# Patient Record
Sex: Female | Born: 1975 | Hispanic: Yes | Marital: Single
Health system: Southern US, Community
[De-identification: ages and names within clinical notes are randomized; demographics above are authoritative.]

## PROBLEM LIST (undated history)

## (undated) HISTORY — PX: HEMORRHOID SURGERY: SHX153

## (undated) HISTORY — PX: TUBAL LIGATION: SHX77

---

## 2004-03-17 ENCOUNTER — Inpatient Hospital Stay: Payer: Self-pay

## 2004-07-15 ENCOUNTER — Ambulatory Visit: Payer: Self-pay | Admitting: Obstetrics and Gynecology

## 2005-03-31 ENCOUNTER — Emergency Department: Payer: Self-pay | Admitting: Internal Medicine

## 2009-10-17 ENCOUNTER — Emergency Department: Payer: Self-pay | Admitting: Emergency Medicine

## 2009-12-03 ENCOUNTER — Ambulatory Visit: Payer: Self-pay | Admitting: Internal Medicine

## 2014-01-27 ENCOUNTER — Ambulatory Visit: Payer: Self-pay | Admitting: Obstetrics and Gynecology

## 2017-06-19 ENCOUNTER — Other Ambulatory Visit: Payer: Self-pay | Admitting: Obstetrics and Gynecology

## 2017-06-19 DIAGNOSIS — Z1231 Encounter for screening mammogram for malignant neoplasm of breast: Secondary | ICD-10-CM

## 2020-12-23 ENCOUNTER — Other Ambulatory Visit: Payer: Self-pay | Admitting: Obstetrics and Gynecology

## 2020-12-23 DIAGNOSIS — Z1231 Encounter for screening mammogram for malignant neoplasm of breast: Secondary | ICD-10-CM

## 2021-01-18 ENCOUNTER — Other Ambulatory Visit: Payer: Self-pay

## 2021-01-18 ENCOUNTER — Ambulatory Visit
Admission: RE | Admit: 2021-01-18 | Discharge: 2021-01-18 | Disposition: A | Discharge status: Home / Self Care | Payer: Managed Care, Other (non HMO) | Source: Ambulatory Visit | Attending: Obstetrics and Gynecology | Admitting: Obstetrics and Gynecology

## 2021-01-18 DIAGNOSIS — Z1231 Encounter for screening mammogram for malignant neoplasm of breast: Secondary | ICD-10-CM | POA: Insufficient documentation

## 2021-12-01 ENCOUNTER — Encounter: Payer: Self-pay | Admitting: *Deleted

## 2021-12-02 ENCOUNTER — Ambulatory Visit: Admission: RE | Admit: 2021-12-02 | Payer: Managed Care, Other (non HMO) | Source: Home / Self Care

## 2021-12-02 ENCOUNTER — Encounter
Admission: RE | Disposition: A | Discharge status: Home / Self Care | Payer: Self-pay | Source: Home / Self Care | Attending: Gastroenterology

## 2021-12-02 ENCOUNTER — Ambulatory Visit
Admission: RE | Admit: 2021-12-02 | Discharge: 2021-12-02 | Disposition: A | Discharge status: Home / Self Care | Payer: Managed Care, Other (non HMO) | Attending: Gastroenterology | Admitting: Gastroenterology

## 2021-12-02 ENCOUNTER — Encounter: Admission: RE | Payer: Self-pay | Source: Home / Self Care

## 2021-12-02 ENCOUNTER — Other Ambulatory Visit: Payer: Self-pay

## 2021-12-02 ENCOUNTER — Ambulatory Visit: Payer: Managed Care, Other (non HMO) | Admitting: Certified Registered"

## 2021-12-02 ENCOUNTER — Encounter: Payer: Self-pay | Admitting: *Deleted

## 2021-12-02 DIAGNOSIS — K449 Diaphragmatic hernia without obstruction or gangrene: Secondary | ICD-10-CM | POA: Diagnosis not present

## 2021-12-02 DIAGNOSIS — Z1211 Encounter for screening for malignant neoplasm of colon: Secondary | ICD-10-CM | POA: Diagnosis present

## 2021-12-02 DIAGNOSIS — D125 Benign neoplasm of sigmoid colon: Secondary | ICD-10-CM | POA: Diagnosis not present

## 2021-12-02 DIAGNOSIS — K640 First degree hemorrhoids: Secondary | ICD-10-CM | POA: Insufficient documentation

## 2021-12-02 DIAGNOSIS — K2100 Gastro-esophageal reflux disease with esophagitis, without bleeding: Secondary | ICD-10-CM | POA: Diagnosis not present

## 2021-12-02 HISTORY — PX: ESOPHAGOGASTRODUODENOSCOPY (EGD) WITH PROPOFOL: SHX5813

## 2021-12-02 HISTORY — PX: COLONOSCOPY WITH PROPOFOL: SHX5780

## 2021-12-02 LAB — POCT PREGNANCY, URINE: Preg Test, Ur: NEGATIVE

## 2021-12-02 SURGERY — COLONOSCOPY WITH PROPOFOL
Anesthesia: General

## 2021-12-02 MED ORDER — SODIUM CHLORIDE 0.9 % IV SOLN: INTRAVENOUS | Status: DC

## 2021-12-02 MED ORDER — LIDOCAINE HCL (CARDIAC) PF 100 MG/5ML IV SOSY
PREFILLED_SYRINGE | INTRAVENOUS | Status: DC | PRN
  Administered 2021-12-02: 40 mg via INTRAVENOUS

## 2021-12-02 MED ORDER — PROPOFOL 500 MG/50ML IV EMUL
INTRAVENOUS | Status: DC | PRN
  Administered 2021-12-02: 125 ug/kg/min via INTRAVENOUS

## 2021-12-02 MED ORDER — PROPOFOL 10 MG/ML IV BOLUS
INTRAVENOUS | Status: DC | PRN
  Administered 2021-12-02: 40 mg via INTRAVENOUS
  Administered 2021-12-02: 100 mg via INTRAVENOUS

## 2021-12-02 NOTE — Transfer of Care (Signed)
Immediate Anesthesia Transfer of Care Note  Patient: [place]  Procedure(s) Performed: COLONOSCOPY WITH PROPOFOL ESOPHAGOGASTRODUODENOSCOPY (EGD) WITH PROPOFOL  Patient Location: PACU  Anesthesia Type:General  Level of Consciousness: awake  Airway & Oxygen Therapy: Patient Spontanous Breathing and Patient connected to nasal cannula oxygen  Post-op Assessment: Report given to RN and Post -op Vital signs reviewed and stable  Post vital signs: Reviewed and stable  Last Vitals:  Vitals Value Taken Time  BP    Temp    Pulse    Resp 22 12/02/21 1037  SpO2    Vitals shown include unvalidated device data.  Last Pain:  Vitals:   12/02/21 0950  TempSrc: Temporal  PainSc: 0-No pain         Complications: No notable events documented.

## 2021-12-02 NOTE — H&P (Signed)
Outpatient short stay form Pre-procedure 12/02/2021  Elizabeth Rubenstein, MD  Primary Physician: Dion Body, MD  Reason for visit:  GERD/Colon cancer screening  History of present illness:    46 y/o lady with history of GERD here for EGD/Colonoscopy for GERD and index colon cancer screening. No blood thinners. No significant abdominal surgeries. First cousin with gastric cancer.   No current facility-administered medications for this encounter.  No medications prior to admission.     Not on File   History reviewed. No pertinent past medical history.  Review of systems:  Otherwise negative.    Physical Exam  Gen: Alert, oriented. Appears stated age.  HEENT: PERRLA. Lungs: No respiratory distress CV: RRR Abd: soft, benign, no masses Ext: No edema    Planned procedures: Proceed with EGD/colonoscopy. The patient understands the nature of the planned procedure, indications, risks, alternatives and potential complications including but not limited to bleeding, infection, perforation, damage to internal organs and possible oversedation/side effects from anesthesia. The patient agrees and gives consent to proceed.  Please refer to procedure notes for findings, recommendations and patient disposition/instructions.     Elizabeth Rubenstein, MD [facility]

## 2021-12-02 NOTE — Anesthesia Postprocedure Evaluation (Signed)
Anesthesia Post Note  Patient: [place]  Procedure(s) Performed: COLONOSCOPY WITH PROPOFOL ESOPHAGOGASTRODUODENOSCOPY (EGD) WITH PROPOFOL  Patient location during evaluation: Endoscopy Anesthesia Type: General Level of consciousness: awake and alert Pain management: pain level controlled Vital Signs Assessment: post-procedure vital signs reviewed and stable Respiratory status: spontaneous breathing, nonlabored ventilation and respiratory function stable Cardiovascular status: blood pressure returned to baseline and stable Postop Assessment: no apparent nausea or vomiting Anesthetic complications: no   No notable events documented.   Last Vitals:  Vitals:   12/02/21 1057 12/02/21 1107  BP: 124/86 (!) 126/95  Pulse: 63 64  Resp: 16 14  Temp:    SpO2: 100% 100%    Last Pain:  Vitals:   12/02/21 1107  TempSrc:   PainSc: 0-No pain                 [place] Ouch

## 2021-12-02 NOTE — Anesthesia Preprocedure Evaluation (Addendum)
Anesthesia Evaluation  Patient identified by MRN, date of birth, ID band Patient awake    Reviewed: Allergy & Precautions, NPO status , Patient's Chart, lab work & pertinent test results  Airway Mallampati: III  TM Distance: >3 FB Neck ROM: full    Dental no notable dental hx.    Pulmonary neg pulmonary ROS,    Pulmonary exam normal        Cardiovascular negative cardio ROS Normal cardiovascular exam     Neuro/Psych negative neurological ROS  negative psych ROS   GI/Hepatic negative GI ROS, Neg liver ROS,   Endo/Other  negative endocrine ROS  Renal/GU negative Renal ROS  negative genitourinary   Musculoskeletal   Abdominal Normal abdominal exam  (+)   Peds  Hematology negative hematology ROS (+)   Anesthesia Other Findings History reviewed. No pertinent past medical history.  History reviewed. No pertinent surgical history.     Reproductive/Obstetrics negative OB ROS                            Anesthesia Physical Anesthesia Plan  ASA: 2  Anesthesia Plan: General   Post-op Pain Management:    Induction: Intravenous  PONV Risk Score and Plan: Propofol infusion and TIVA  Airway Management Planned: Natural Airway  Additional Equipment:   Intra-op Plan:   Post-operative Plan:   Informed Consent: I have reviewed the patients History and Physical, chart, labs and discussed the procedure including the risks, benefits and alternatives for the proposed anesthesia with the patient or authorized representative who has indicated his/her understanding and acceptance.     Dental Advisory Given  Plan Discussed with: Anesthesiologist, CRNA and Surgeon  Anesthesia Plan Comments:        Anesthesia Quick Evaluation

## 2021-12-02 NOTE — Op Note (Signed)
[facility] Patient Name: Elizabeth Wagner Procedure Date: 12/02/2021 9:58 AM MRN: [id number] Account #: [id number] Date of Birth: [birth year] Admit Type: Outpatient Age: 46 Room: [facility] ENDO ROOM 3 Gender: Female Note Status: Finalized Instrument Name: Jasper Riling [id number] Procedure:             Colonoscopy Indications:           Screening for colorectal malignant neoplasm Providers:             Andrey Farmer MD, MD Referring MD:          Dion Body (Referring MD) Medicines:             Monitored Anesthesia Care Complications:         No immediate complications. Estimated blood loss:                         Minimal. Procedure:             Pre-Anesthesia Assessment:                        - Prior to the procedure, a History and Physical was                         performed, and patient medications and allergies were                         reviewed. The patient is competent. The risks and                         benefits of the procedure and the sedation options and                         risks were discussed with the patient. All questions                         were answered and informed consent was obtained.                         Patient identification and proposed procedure were                         verified by the physician, the nurse, the                         anesthesiologist, the anesthetist and the technician                         in the endoscopy suite. Mental Status Examination:                         alert and oriented. Airway Examination: normal                         oropharyngeal airway and neck mobility. Respiratory                         Examination: clear to auscultation. CV Examination:  normal. Prophylactic Antibiotics: The patient does not                         require prophylactic antibiotics. Prior                         Anticoagulants: The patient has taken no previous                          anticoagulant or antiplatelet agents. ASA Grade                         Assessment: I - A normal, healthy patient. After                         reviewing the risks and benefits, the patient was                         deemed in satisfactory condition to undergo the                         procedure. The anesthesia plan was to use monitored                         anesthesia care (MAC). Immediately prior to                         administration of medications, the patient was                         re-assessed for adequacy to receive sedatives. The                         heart rate, respiratory rate, oxygen saturations,                         blood pressure, adequacy of pulmonary ventilation, and                         response to care were monitored throughout the                         procedure. The physical status of the patient was                         re-assessed after the procedure.                        After obtaining informed consent, the colonoscope was                         passed under direct vision. Throughout the procedure,                         the patient's blood pressure, pulse, and oxygen                         saturations were monitored continuously. The  Colonoscope was introduced through the anus and                         advanced to the the cecum, identified by appendiceal                         orifice and ileocecal valve. The colonoscopy was                         performed without difficulty. The patient tolerated                         the procedure well. The quality of the bowel                         preparation was good. Findings:      The perianal and digital rectal examinations were normal.      A 3 mm polyp was found in the sigmoid colon. The polyp was sessile. The       polyp was removed with a cold snare. Resection and retrieval were       complete. Estimated blood loss was minimal.      Internal  hemorrhoids were found during retroflexion. The hemorrhoids       were Grade I (internal hemorrhoids that do not prolapse).      The exam was otherwise without abnormality on direct and retroflexion       views. Impression:            - One 3 mm polyp in the sigmoid colon, removed with a                         cold snare. Resected and retrieved.                        - Internal hemorrhoids.                        - The examination was otherwise normal on direct and                         retroflexion views. Recommendation:        - Discharge patient to home.                        - Resume previous diet.                        - Continue present medications.                        - Await pathology results.                        - Return to referring physician as previously                         scheduled. Procedure Code(s):     --- Professional ---                        [id number], Colonoscopy, flexible; with removal of  tumor(s), polyp(s), or other lesion(s) by snare                         technique Diagnosis Code(s):     --- Professional ---                        Z12.11, Encounter for screening for malignant neoplasm                         of colon                        K63.5, Polyp of colon                        K64.0, First degree hemorrhoids CPT copyright 2019 American [employer]. All rights reserved. The codes documented in this report are preliminary and upon coder review may  be revised to meet current compliance requirements. Andrey Farmer MD, MD 12/02/2021 10:50:06 AM Number of Addenda: 0 Note Initiated On: 12/02/2021 9:58 AM Scope Withdrawal Time: 0 hours 5 minutes 15 seconds  Total Procedure Duration: 0 hours 12 minutes 14 seconds  Estimated Blood Loss:  Estimated blood loss was minimal.      [facility]

## 2021-12-02 NOTE — Interval H&P Note (Signed)
History and Physical Interval Note:  12/02/2021 9:59 AM  Elizabeth Wagner  has presented today for surgery, with the diagnosis of Z12.11  - Colon cancer screening K21.9 - Gastroesophageal reflux disease without esophagitis.  The various methods of treatment have been discussed with the patient and family. After consideration of risks, benefits and other options for treatment, the patient has consented to  Procedure(s): COLONOSCOPY WITH PROPOFOL (N/A) ESOPHAGOGASTRODUODENOSCOPY (EGD) WITH PROPOFOL (N/A) as a surgical intervention.  The patient's history has been reviewed, patient examined, no change in status, stable for surgery.  I have reviewed the patient's chart and labs.  Questions were answered to the patient's satisfaction.     Lesly Rubenstein  Ok to proceed with EGD/Colonoscopy

## 2021-12-02 NOTE — Op Note (Signed)
[facility] Patient Name: Elizabeth Wagner Procedure Date: 12/02/2021 9:58 AM MRN: [id number] Account #: [id number] Date of Birth: 1975-06-20 Admit Type: Outpatient Age: 46 Room: [facility] ENDO ROOM 3 Gender: Female Note Status: Finalized Instrument Name: Upper Endoscope [id number] Procedure:             Upper GI endoscopy Indications:           Gastro-esophageal reflux disease Providers:             Andrey Farmer MD, MD Referring MD:          Dion Body (Referring MD) Medicines:             Monitored Anesthesia Care Complications:         No immediate complications. Estimated blood loss:                         Minimal. Procedure:             Pre-Anesthesia Assessment:                        - Prior to the procedure, a History and Physical was                         performed, and patient medications and allergies were                         reviewed. The patient is competent. The risks and                         benefits of the procedure and the sedation options and                         risks were discussed with the patient. All questions                         were answered and informed consent was obtained.                         Patient identification and proposed procedure were                         verified by the physician, the nurse, the                         anesthesiologist, the anesthetist and the technician                         in the endoscopy suite. Mental Status Examination:                         alert and oriented. Airway Examination: normal                         oropharyngeal airway and neck mobility. Respiratory                         Examination: clear to auscultation. CV Examination:  normal. Prophylactic Antibiotics: The patient does not                         require prophylactic antibiotics. Prior                         Anticoagulants: The patient has taken no previous                          anticoagulant or antiplatelet agents. ASA Grade                         Assessment: I - A normal, healthy patient. After                         reviewing the risks and benefits, the patient was                         deemed in satisfactory condition to undergo the                         procedure. The anesthesia plan was to use monitored                         anesthesia care (MAC). Immediately prior to                         administration of medications, the patient was                         re-assessed for adequacy to receive sedatives. The                         heart rate, respiratory rate, oxygen saturations,                         blood pressure, adequacy of pulmonary ventilation, and                         response to care were monitored throughout the                         procedure. The physical status of the patient was                         re-assessed after the procedure.                        After obtaining informed consent, the endoscope was                         passed under direct vision. Throughout the procedure,                         the patient's blood pressure, pulse, and oxygen                         saturations were monitored continuously. The Endoscope  was introduced through the mouth, and advanced to the                         second part of duodenum. The upper GI endoscopy was                         accomplished without difficulty. The patient tolerated                         the procedure well. Findings:      LA Grade A (one or more mucosal breaks less than 5 mm, not extending       between tops of 2 mucosal folds) esophagitis with no bleeding was found.      A small hiatal hernia was present.      The exam of the esophagus was otherwise normal.      A single 3 mm submucosal papule (nodule) with no bleeding and no       stigmata of recent bleeding was found at the pylorus. Biopsies were       taken with a  cold forceps for histology. Estimated blood loss was       minimal. 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      Biopsies were taken with a cold forceps in the stomach for Helicobacter       pylori testing. Estimated blood loss was minimal.      The examined duodenum was normal. Impression:            - LA Grade A reflux esophagitis with no bleeding.                        - Small hiatal hernia.                        - A single submucosal papule (nodule) found in the                         stomach. Biopsied.                        - Normal examined duodenum.                        - Biopsies were taken with a cold forceps for                         Helicobacter pylori testing. Recommendation:        - Discharge patient to home.                        - Resume previous diet.                        - Continue present medications.                        - Await pathology results.                        - Return to referring physician as previously  scheduled. Procedure Code(s):     --- Professional ---                        [phone], Esophagogastroduodenoscopy, flexible,                         transoral; with biopsy, single or multiple Diagnosis Code(s):     --- Professional ---                        K21.00, Gastro-esophageal reflux disease with                         esophagitis, without bleeding                        K44.9, Diaphragmatic hernia without obstruction or                         gangrene                        K31.89, Other diseases of stomach and duodenum CPT copyright 2019 American Medical Association. All rights reserved. The codes documented in this report are preliminary and upon coder review may  be revised to meet current compliance requirements. Andrey Farmer MD, MD 12/02/2021 10:45:22 AM Number of Addenda: 0 Note Initiated On: 12/02/2021 9:58 AM Estimated Blood Loss:  Estimated blood loss was minimal.      [facility]

## 2021-12-05 ENCOUNTER — Encounter: Payer: Self-pay | Admitting: Gastroenterology

## 2021-12-06 LAB — SURGICAL PATHOLOGY

## 2022-05-23 IMAGING — MG MM DIGITAL SCREENING BILAT W/ TOMO AND CAD
8 series · 9 of 24 positions shown · non-contrast
Comparison: Previous exam(s).

CLINICAL DATA: Screening.

EXAM:
DIGITAL SCREENING BILATERAL MAMMOGRAM WITH TOMOSYNTHESIS AND CAD
TECHNIQUE: Bilateral screening digital craniocaudal and mediolateral oblique
mammograms were obtained. Bilateral screening digital breast
tomosynthesis was performed. The images were evaluated with
computer-aided detection.

[L CC synth-2D]
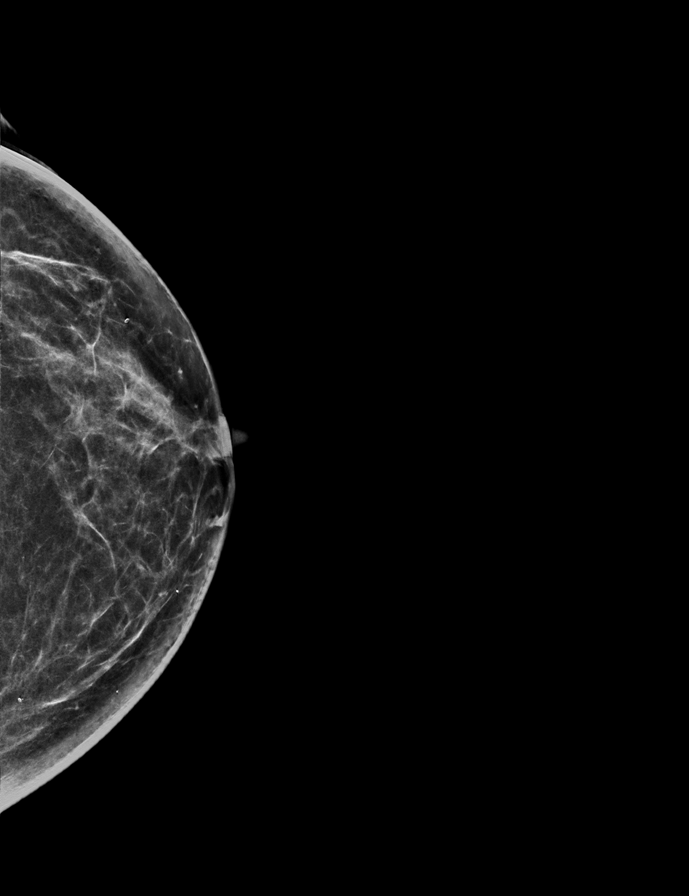

[R CC synth-2D]
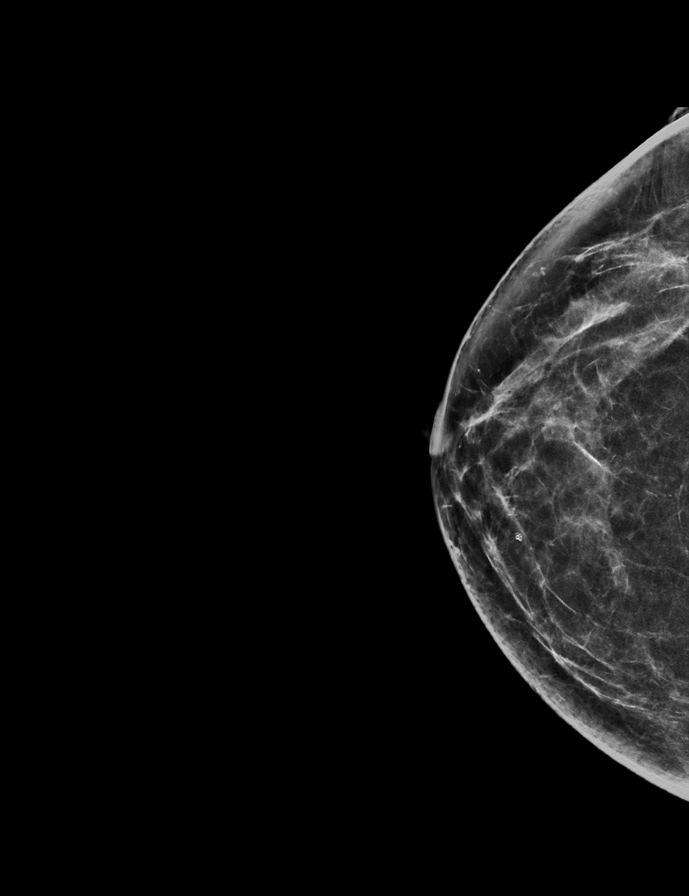

[R MLO synth-2D]
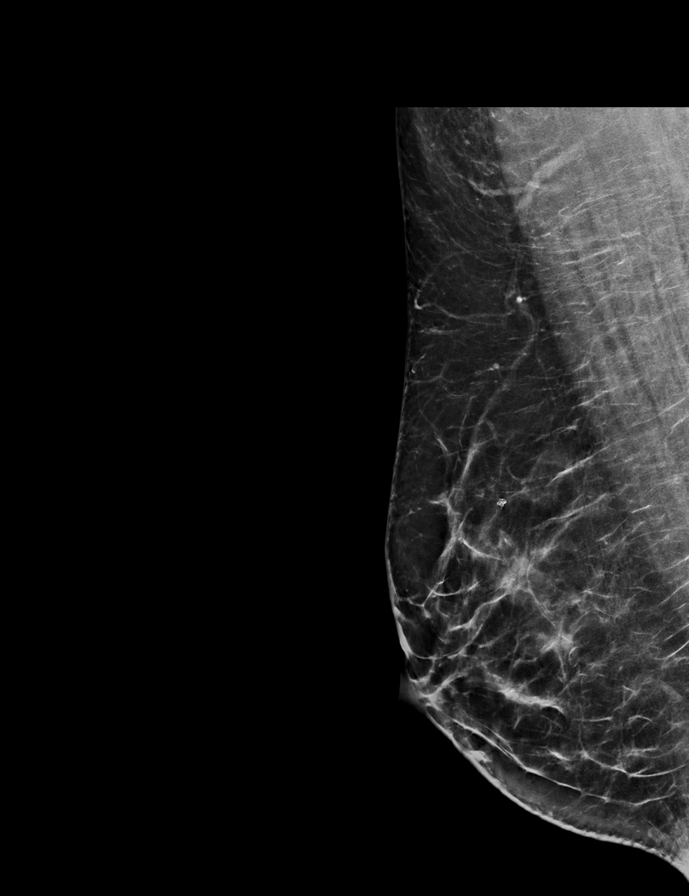

[L MLO synth-2D]
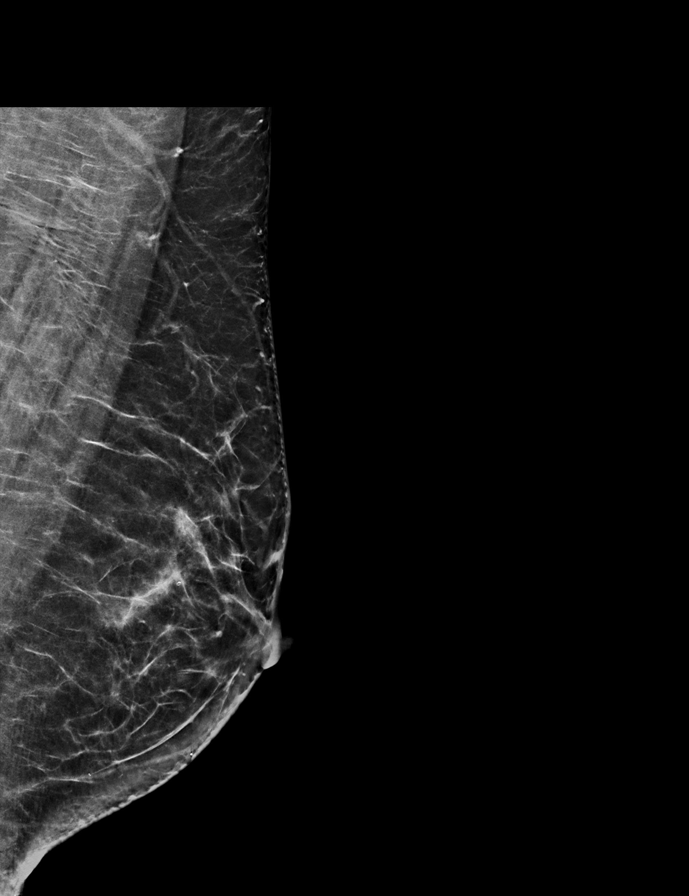

[R CC tomo · 2 of 63 frames shown]
[frame 21/63]
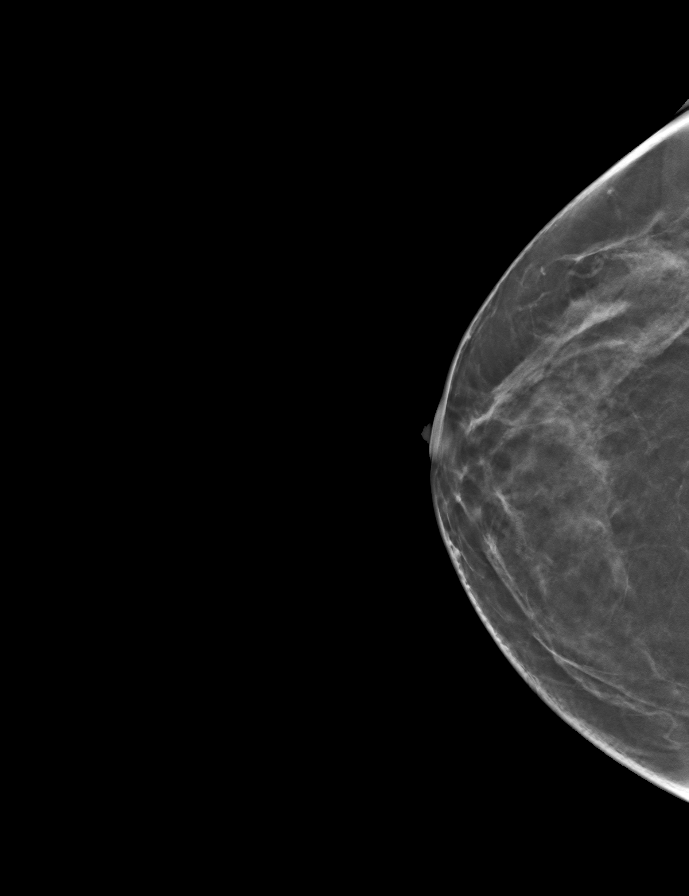
[frame 32/63]
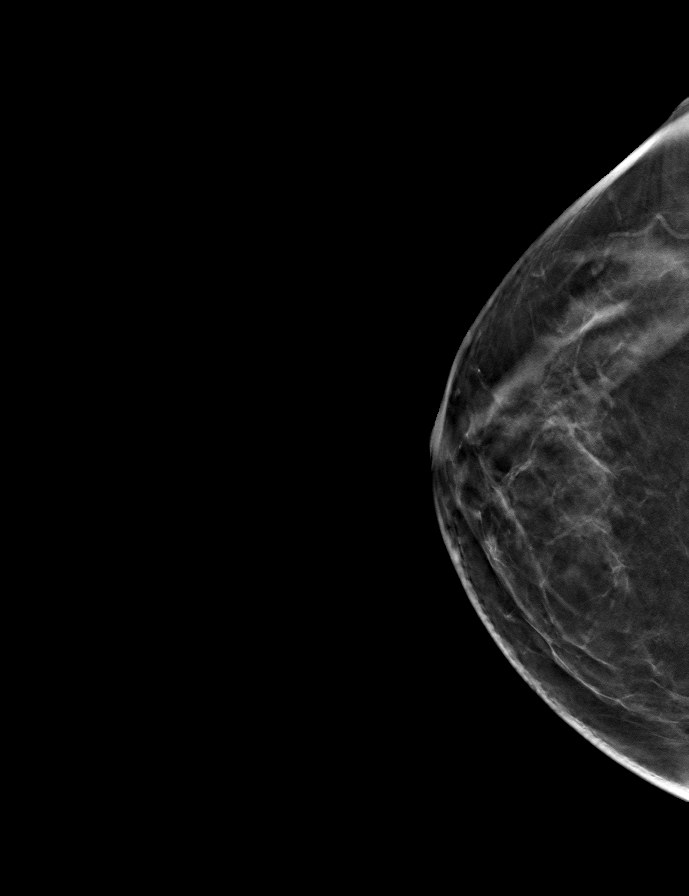

[L CC tomo · tomo slice 33/64.0]
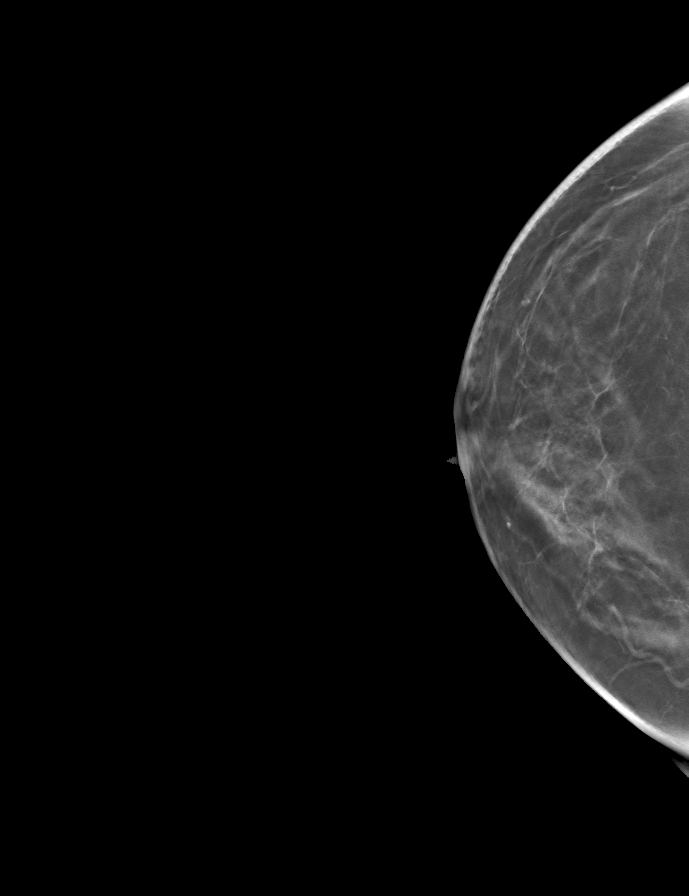

[L MLO tomo · tomo slice 33/66.0]
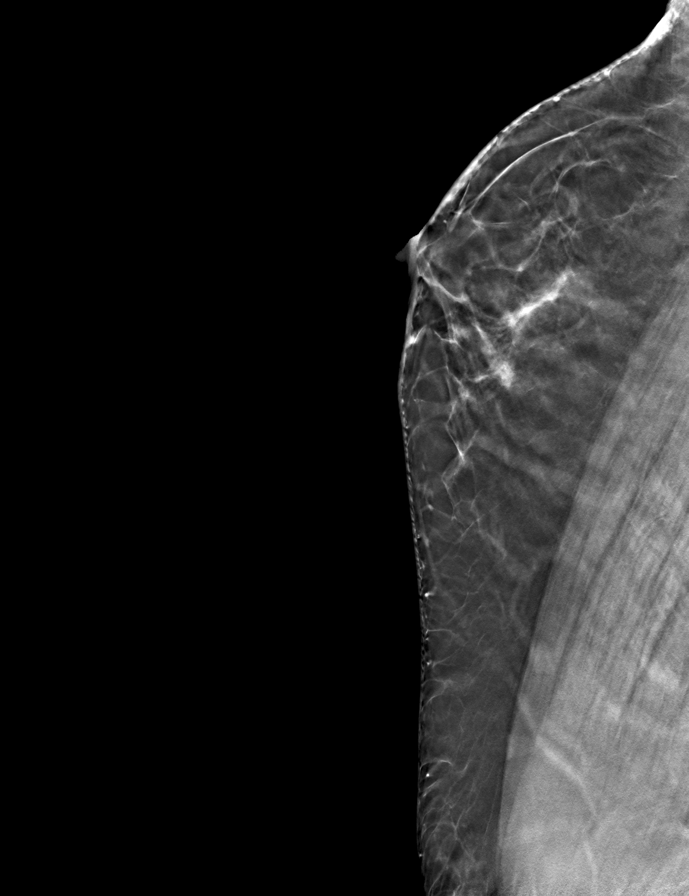

[R MLO tomo · tomo slice 37/73.0]
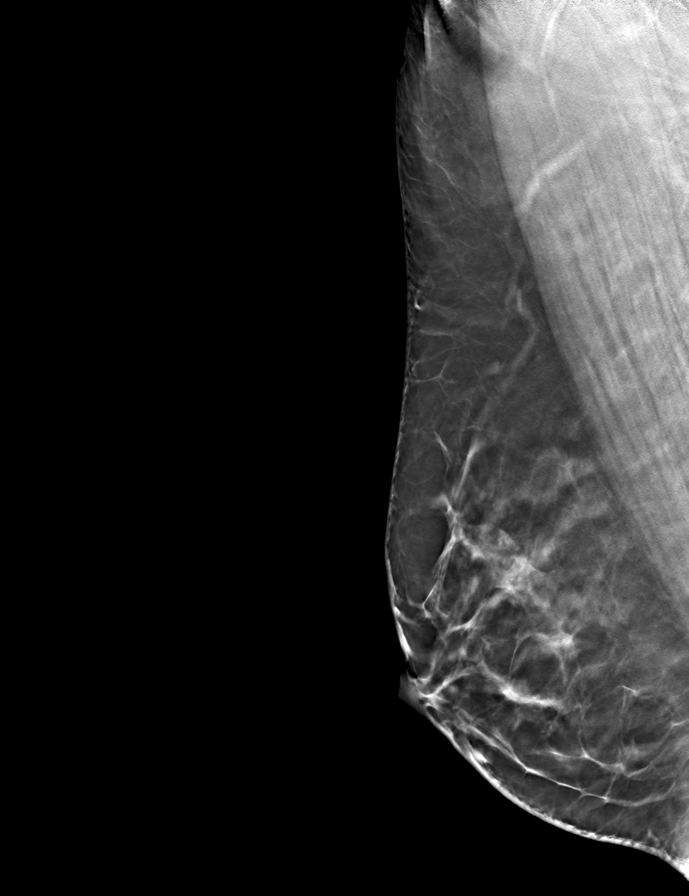

[9 of 24 positions shown; findings below may reference images not displayed]

ACR Breast Density Category b: There are scattered areas of
fibroglandular density.
FINDINGS: There are no findings suspicious for malignancy.
IMPRESSION: No mammographic evidence of malignancy. A result letter of this
screening mammogram will be mailed directly to the patient.

RECOMMENDATION:
Screening mammogram in one year. (Code:51-O-LD2)

BI-RADS CATEGORY  1: Negative.

## 2023-04-16 ENCOUNTER — Other Ambulatory Visit: Payer: Self-pay | Admitting: Family Medicine

## 2023-04-16 DIAGNOSIS — Z1231 Encounter for screening mammogram for malignant neoplasm of breast: Secondary | ICD-10-CM

## 2023-04-27 ENCOUNTER — Encounter: Payer: Self-pay | Admitting: Radiology

## 2023-04-27 ENCOUNTER — Ambulatory Visit
Admission: RE | Admit: 2023-04-27 | Discharge: 2023-04-27 | Disposition: A | Discharge status: Home / Self Care | Payer: PRIVATE HEALTH INSURANCE | Source: Ambulatory Visit | Attending: Family Medicine | Admitting: Family Medicine

## 2023-04-27 DIAGNOSIS — Z1231 Encounter for screening mammogram for malignant neoplasm of breast: Secondary | ICD-10-CM | POA: Diagnosis present
